# Patient Record
Sex: Female | Born: 1975 | Race: White | Hispanic: No | Marital: Married | Smoking: Never smoker
Health system: Southern US, Community
[De-identification: ages and names within clinical notes are randomized; demographics above are authoritative.]

---

## 2014-10-01 ENCOUNTER — Encounter (HOSPITAL_COMMUNITY): Payer: Self-pay | Admitting: Emergency Medicine

## 2014-10-01 ENCOUNTER — Emergency Department (INDEPENDENT_AMBULATORY_CARE_PROVIDER_SITE_OTHER): Payer: BLUE CROSS/BLUE SHIELD

## 2014-10-01 ENCOUNTER — Emergency Department (HOSPITAL_COMMUNITY)
Admission: EM | Admit: 2014-10-01 | Discharge: 2014-10-01 | Disposition: A | Discharge status: Home / Self Care | Payer: BLUE CROSS/BLUE SHIELD | Source: Home / Self Care | Attending: Family Medicine | Admitting: Family Medicine

## 2014-10-01 DIAGNOSIS — K5901 Slow transit constipation: Secondary | ICD-10-CM | POA: Diagnosis not present

## 2014-10-01 LAB — POCT URINALYSIS DIP (DEVICE)
Bilirubin Urine: NEGATIVE
GLUCOSE, UA: NEGATIVE mg/dL
KETONES UR: NEGATIVE mg/dL
LEUKOCYTES UA: NEGATIVE
Nitrite: NEGATIVE
PH: 6 (ref 5.0–8.0)
PROTEIN: 100 mg/dL — AB
Specific Gravity, Urine: 1.02 (ref 1.005–1.030)
Urobilinogen, UA: 0.2 mg/dL (ref 0.0–1.0)

## 2014-10-01 LAB — POCT PREGNANCY, URINE: Preg Test, Ur: NEGATIVE

## 2014-10-01 NOTE — Discharge Instructions (Signed)
Constipation Miralax powder. Mix 1 to 1 1/2 capfuls in 6 to 8 oz of water or other drink and consume at least 4 glasses this PM. Also recommend using a mineral oil enema (Fleets) first to try to cleans rectum. Constipation is when a person has fewer than three bowel movements a week, has difficulty having a bowel movement, or has stools that are dry, hard, or larger than normal. As people grow older, constipation is more common. If you try to fix constipation with medicines that make you have a bowel movement (laxatives), the problem may get worse. Long-term laxative use may cause the muscles of the colon to become weak. A low-fiber diet, not taking in enough fluids, and taking certain medicines may make constipation worse.  CAUSES   Certain medicines, such as antidepressants, pain medicine, iron supplements, antacids, and water pills.   Certain diseases, such as diabetes, irritable bowel syndrome (IBS), thyroid disease, or depression.   Not drinking enough water.   Not eating enough fiber-rich foods.   Stress or travel.   Lack of physical activity or exercise.   Ignoring the urge to have a bowel movement.   Using laxatives too much.  SIGNS AND SYMPTOMS   Having fewer than three bowel movements a week.   Straining to have a bowel movement.   Having stools that are hard, dry, or larger than normal.   Feeling full or bloated.   Pain in the lower abdomen.   Not feeling relief after having a bowel movement.  DIAGNOSIS  Your health care provider will take a medical history and perform a physical exam. Further testing may be done for severe constipation. Some tests may include:  A barium enema X-ray to examine your rectum, colon, and, sometimes, your small intestine.   A sigmoidoscopy to examine your lower colon.   A colonoscopy to examine your entire colon. TREATMENT  Treatment will depend on the severity of your constipation and what is causing it. Some dietary  treatments include drinking more fluids and eating more fiber-rich foods. Lifestyle treatments may include regular exercise. If these diet and lifestyle recommendations do not help, your health care provider may recommend taking over-the-counter laxative medicines to help you have bowel movements. Prescription medicines may be prescribed if over-the-counter medicines do not work.  HOME CARE INSTRUCTIONS   Eat foods that have a lot of fiber, such as fruits, vegetables, whole grains, and beans.  Limit foods high in fat and processed sugars, such as french fries, hamburgers, cookies, candies, and soda.   A fiber supplement may be added to your diet if you cannot get enough fiber from foods.   Drink enough fluids to keep your urine clear or pale yellow.   Exercise regularly or as directed by your health care provider.   Go to the restroom when you have the urge to go. Do not hold it.   Only take over-the-counter or prescription medicines as directed by your health care provider. Do not take other medicines for constipation without talking to your health care provider first.  SEEK IMMEDIATE MEDICAL CARE IF:   You have bright red blood in your stool.   Your constipation lasts for more than 4 days or gets worse.   You have abdominal or rectal pain.   You have thin, pencil-like stools.   You have unexplained weight loss. MAKE SURE YOU:   Understand these instructions.  Will watch your condition.  Will get help right away if you are not doing well  or get worse. Document Released: 12/27/2003 Document Revised: 04/04/2013 Document Reviewed: 01/09/2013 [facility] Patient Information 2015 [employer], [place]. This information is not intended to replace advice given to you by your health care provider. Make sure you discuss any questions you have with your health care provider.

## 2014-10-01 NOTE — ED Provider Notes (Signed)
CSN: [id number]     Arrival date & time 10/01/14  1340 History   First MD Initiated Contact with Patient 10/01/14 1507     Chief Complaint  Patient presents with  . Abdominal Pain   (Consider location/radiation/quality/duration/timing/severity/associated sxs/prior Treatment) HPI Comments: 39 year old female states that she has been constipated for about 2 months. She has attributed to stress at work. She admits to having similar episodes of constipation for prolonged periods of time while she was at school. She has taken several over-the-counter medications including mag citrate, senna, Dulcolax and others all at higher doses than recommended with little to no results. She recalls that her last normal bowel movement was about 2 months ago.    History reviewed. No pertinent past medical history. History reviewed. No pertinent past surgical history. No family history on file. History  Substance Use Topics  . Smoking status: Never Smoker   . Smokeless tobacco: Not on file  . Alcohol Use: Yes   OB History    No data available     Review of Systems  Constitutional: Positive for activity change. Negative for fever and fatigue.  Respiratory: Negative.   Cardiovascular: Negative.   Gastrointestinal: Positive for abdominal pain and constipation. Negative for diarrhea, blood in stool and abdominal distention.       Generalized abdominal pain This morning with an episode of nausea and vomiting.  Genitourinary: Negative.   Musculoskeletal: Negative for myalgias and back pain.  Skin: Negative.   Neurological: Negative.     Allergies  Review of patient's allergies indicates not on file.  Home Medications   Prior to Admission medications   Not on File   BP 106/67 mmHg  Pulse 79  Temp(Src) 98.6 F (37 C) (Oral)  Resp 18  SpO2 100%  LMP 09/26/2014 Physical Exam  Constitutional: She is oriented to person, place, and time. She appears well-developed and well-nourished. No distress.   Eyes: EOM are normal.  Neck: Normal range of motion. Neck supple.  Cardiovascular: Normal rate and regular rhythm.   Pulmonary/Chest: Effort normal and breath sounds normal. No respiratory distress.  Abdominal: Soft. Bowel sounds are normal. She exhibits no distension and no mass.  Mild to moderate generalized tenderness. No rebound or guarding.  Musculoskeletal: She exhibits no edema or tenderness.  Neurological: She is alert and oriented to person, place, and time. She exhibits normal muscle tone.  Skin: Skin is warm and dry. No rash noted. No erythema.  Psychiatric: She has a normal mood and affect.  Nursing note and vitals reviewed.   ED Course  Procedures (including critical care time) Labs Review Labs Reviewed  POCT URINALYSIS DIP (DEVICE) - Abnormal; Notable for the following:    Hgb urine dipstick TRACE (*)    Protein, ur 100 (*)    All other components within normal limits  POCT PREGNANCY, URINE   Results for orders placed or performed during the hospital encounter of 10/01/14  POCT urinalysis dip (device)  Result Value Ref Range   Glucose, UA NEGATIVE NEGATIVE mg/dL   Bilirubin Urine NEGATIVE NEGATIVE   Ketones, ur NEGATIVE NEGATIVE mg/dL   Specific Gravity, Urine 1.020 1.005 - 1.030   Hgb urine dipstick TRACE (A) NEGATIVE   pH 6.0 5.0 - 8.0   Protein, ur 100 (A) NEGATIVE mg/dL   Urobilinogen, UA 0.2 0.0 - 1.0 mg/dL   Nitrite NEGATIVE NEGATIVE   Leukocytes, UA NEGATIVE NEGATIVE  Pregnancy, urine POC  Result Value Ref Range   Preg Test, Ur NEGATIVE  NEGATIVE     Imaging Review Dg Abd 1 View  10/01/2014   CLINICAL DATA:  Abdominal pain.  Constipated for 2 months.  EXAM: ABDOMEN - 1 VIEW  COMPARISON:  None.  FINDINGS: Nonobstructive bowel-gas pattern. No free intraperitoneal air. No abnormal abdominal calcifications. No appendicolith. Distal gas and stool.  IMPRESSION: No acute findings.   Electronically Signed   By: Jeronimo Greaves M.D.   On: 10/01/2014 16:36      MDM   1. Slow transit constipation    Miralax powder. Mix 1 to 1 1/2 capfuls in 6 to 8 oz of water or other drink and consume at least 4 glasses this PM. Also recommend using a mineral oil enema (Fleets) first to try to cleans rectum. See your PCP this week. May need referral to GI    Hayden Rasmussen, NP 10/01/14 1652

## 2014-10-01 NOTE — ED Notes (Signed)
Pt c/o abd pain onset 2 months associated w/constipation, nauseas, vomiting and has noticed mucousy stools.  Denies fevers, chills Alert, no signs of acute distress

## 2016-11-27 IMAGING — DX DG ABDOMEN 1V
1 series · 1 of 1 positions shown · non-contrast
Comparison: None.

CLINICAL DATA: Abdominal pain.  Constipated for 2 months.

EXAM:
ABDOMEN - 1 VIEW

[abdomen kub]
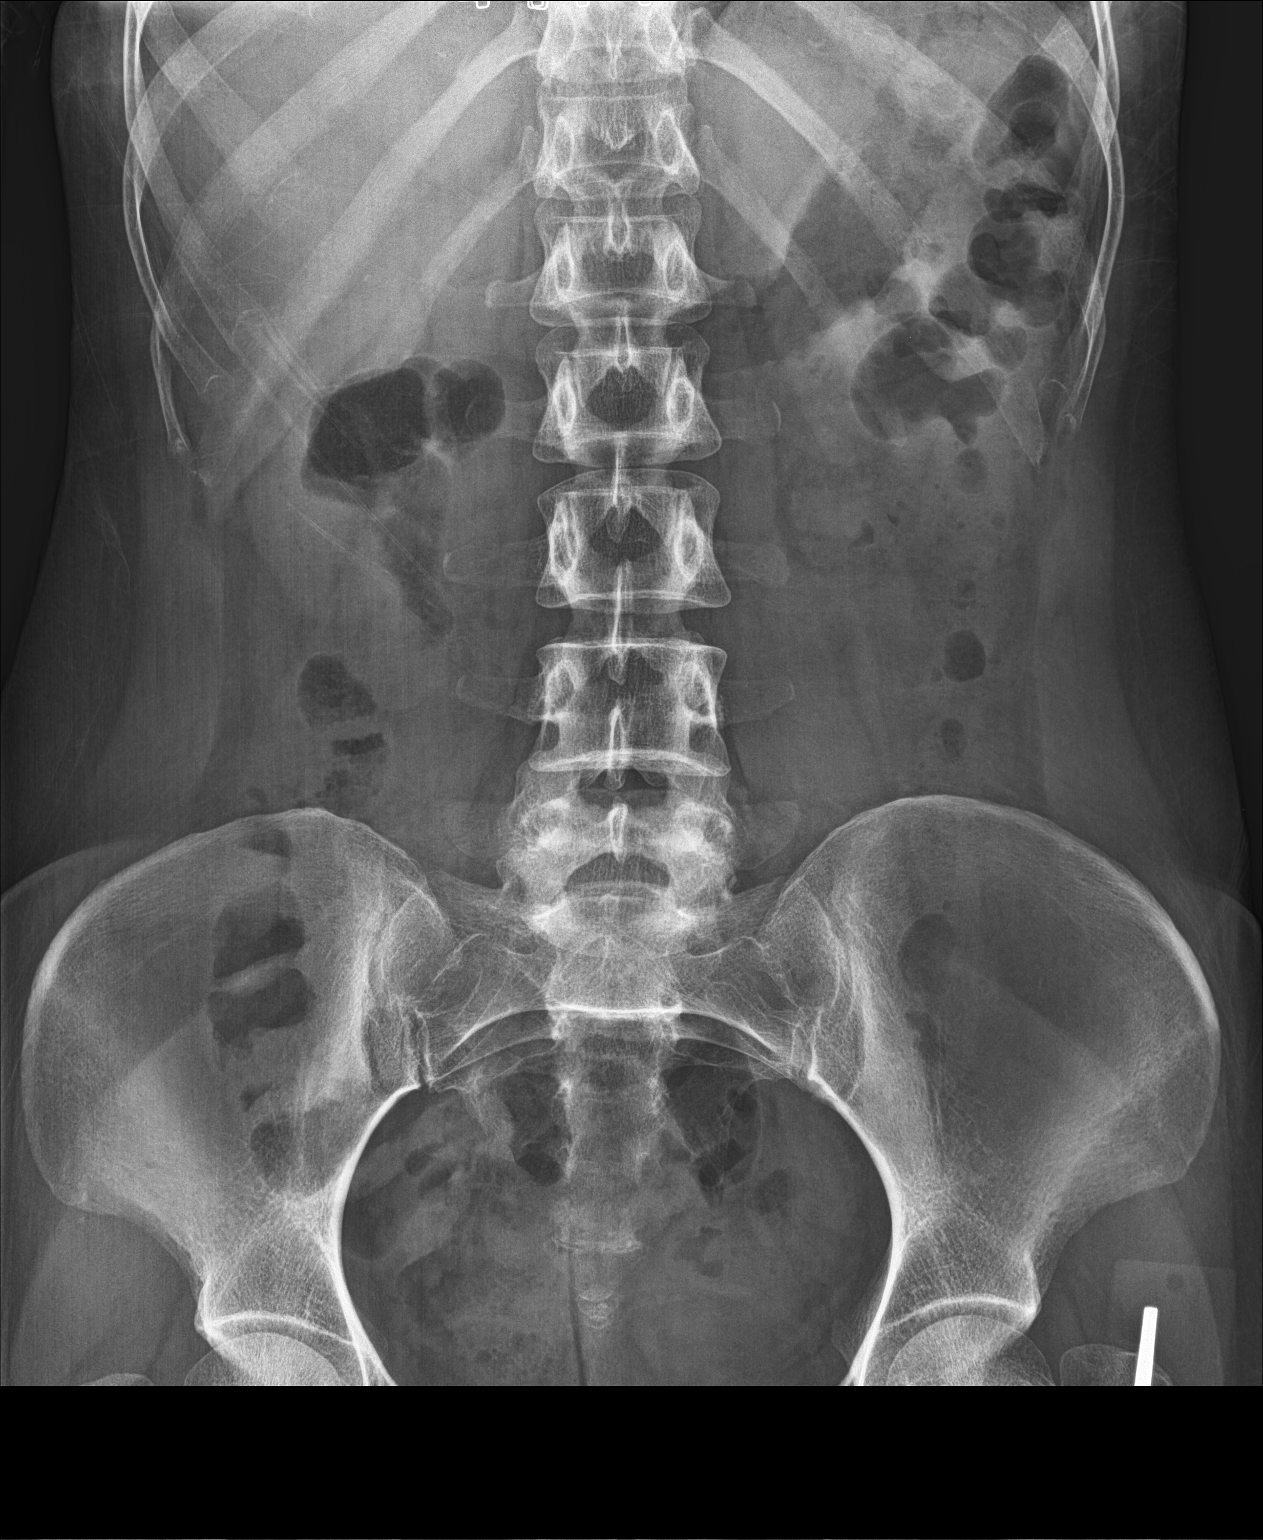

[1 of 1 positions shown; findings below may reference images not displayed]

FINDINGS: Nonobstructive bowel-gas pattern. No free intraperitoneal air. No
abnormal abdominal calcifications. No appendicolith. Distal gas and
stool.
IMPRESSION: No acute findings.
